# Patient Record
Sex: Female | Born: 1995 | Race: White | Hispanic: No | Marital: Single | Smoking: Never smoker
Health system: Southern US, Community
[De-identification: ages and names within clinical notes are randomized; demographics above are authoritative.]

---
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
## 2020-03-05 ENCOUNTER — Inpatient Hospital Stay (HOSPITAL_COMMUNITY)
Admission: AD | Admit: 2020-03-05 | Discharge: 2020-03-05 | Disposition: A | Discharge status: Home / Self Care | Payer: BC Managed Care – PPO | Attending: Obstetrics & Gynecology | Admitting: Obstetrics & Gynecology

## 2020-03-05 ENCOUNTER — Telehealth: Payer: Self-pay | Admitting: Student

## 2020-03-05 ENCOUNTER — Encounter (HOSPITAL_COMMUNITY): Payer: Self-pay

## 2020-03-05 DIAGNOSIS — N939 Abnormal uterine and vaginal bleeding, unspecified: Secondary | ICD-10-CM | POA: Insufficient documentation

## 2020-03-05 DIAGNOSIS — R42 Dizziness and giddiness: Secondary | ICD-10-CM | POA: Insufficient documentation

## 2020-03-05 DIAGNOSIS — Z3202 Encounter for pregnancy test, result negative: Secondary | ICD-10-CM | POA: Diagnosis not present

## 2020-03-05 DIAGNOSIS — Z3200 Encounter for pregnancy test, result unknown: Secondary | ICD-10-CM | POA: Insufficient documentation

## 2020-03-05 DIAGNOSIS — R109 Unspecified abdominal pain: Secondary | ICD-10-CM | POA: Diagnosis not present

## 2020-03-05 DIAGNOSIS — R110 Nausea: Secondary | ICD-10-CM

## 2020-03-05 LAB — COMPREHENSIVE METABOLIC PANEL
ALT: 15 U/L (ref 0–44)
AST: 16 U/L (ref 15–41)
Albumin: 4.1 g/dL (ref 3.5–5.0)
Alkaline Phosphatase: 38 U/L (ref 38–126)
Anion gap: 9 (ref 5–15)
BUN: 12 mg/dL (ref 6–20)
CO2: 25 mmol/L (ref 22–32)
Calcium: 9.1 mg/dL (ref 8.9–10.3)
Chloride: 105 mmol/L (ref 98–111)
Creatinine, Ser: 0.94 mg/dL (ref 0.44–1.00)
GFR, Estimated: >60 mL/min (ref >60)
Glucose, Bld: 98 mg/dL (ref 70–99)
Potassium: 4.4 mmol/L (ref 3.5–5.1)
Sodium: 139 mmol/L (ref 135–145)
Total Bilirubin: 0.8 mg/dL (ref 0.3–1.2)
Total Protein: 6.9 g/dL (ref 6.5–8.1)

## 2020-03-05 LAB — CBC
HCT: 40.7 % (ref 36.0–46.0)
Hemoglobin: 13.3 g/dL (ref 12.0–15.0)
MCH: 28 pg (ref 26.0–34.0)
MCHC: 32.7 g/dL (ref 30.0–36.0)
MCV: 85.7 fL (ref 80.0–100.0)
Platelets: 213 10*3/uL (ref 150–400)
RBC: 4.75 MIL/uL (ref 3.87–5.11)
RDW: 12.2 % (ref 11.5–15.5)
WBC: 9.9 10*3/uL (ref 4.0–10.5)
nRBC: 0 % (ref 0.0–0.2)

## 2020-03-05 LAB — ABO/RH: ABO/RH(D): B POS

## 2020-03-05 LAB — HCG, QUANTITATIVE, PREGNANCY: hCG, Beta Chain, Quant, S: <1 m[IU]/mL (ref <5)

## 2020-03-05 LAB — POCT PREGNANCY, URINE: Preg Test, Ur: NEGATIVE

## 2020-03-05 NOTE — MAU Provider Note (Signed)
Patient Kristy Horton is a 25 y.o. No obstetric history on file.  at Unknown here with complaints of abdominal cramping and bleeding and concern that she might be pregnant or miscarrying.    She reports that she took a pregnancy test on 12/26 that was negative, "just to make sure" after she had started the Nuvaring on 12/7. She was prescribed the Nuvaring by her MD at [facility].   Tuesday 12/28 she started to feel nauseated, light headed, and had some light bleeding. She also had a "clump" of blood and "tissue" that came out on Tuesday night. She continued to have cramps and some dark blood that was not heavy. She had some "darker blood" when she woke up but is not having any heavy bleeding today. She reports "a little" bit of dizziness today, but not as bad as on Tuesday. She was driven here by her friend.   She took two pregnancy tests this morning and they were both faintly positive. Patient showed photos on her phone of faintly positive pregnancy test.     Review of Systems  Constitutional: Negative.   HENT: Negative.   Respiratory: Negative.   Cardiovascular: Negative.   Gastrointestinal: Negative.   Neurological: Negative.   Psychiatric/Behavioral: Negative.    Physical Exam   Blood pressure 132/78, pulse 81, temperature 98.6 F (37 C), temperature source Oral, resp. rate 16, last menstrual period 12/30/2019, SpO2 100 %.  Physical Exam Constitutional:      Appearance: Normal appearance.  Musculoskeletal:        General: Normal range of motion.  Skin:    General: Skin is warm.  Neurological:     Mental Status: She is alert.     MAU Course  Procedures  MDM Assessment and Plan     -will draw ectopic labs and patient agrees to wait at home for phone call.   Charlesetta Garibaldi Thula Stewart 03/05/2020, 12:50 PM

## 2020-03-05 NOTE — Telephone Encounter (Signed)
Called patient and notified her of bHCG level less than 1. Explained that patient is not pregnant and unlikely that she was recently pregnant. Explained that her irregular bleeding can be due to recently starting the Nuvaring. She should track her symptoms and her menstrual cycle to look for patterns. Recommended she follow up with gyn as needed.   Kristy Horton

## 2020-03-05 NOTE — Discharge Instructions (Signed)
-  we will call you within 3 hours; you must be available to come back for further work-up.

## 2020-03-05 NOTE — MAU Note (Signed)
Pt reports to [facility] with c/o vag bleeding after having pos hpt. Today.  Pt reports neg hpt on last sunday.  Pt states she recently started a new birth control this month.  Pt reports passing large clot on Tuesday, but has not seen any since.  Pt also having some lower abd cramping and back pain.

## 2020-10-17 ENCOUNTER — Emergency Department (HOSPITAL_COMMUNITY)
Admission: EM | Admit: 2020-10-17 | Discharge: 2020-10-17 | Discharge status: Against Medical Advice | Payer: BC Managed Care – PPO
